# Patient Record
Sex: Male | Born: 1940 | Race: White | Hispanic: No | Marital: Married | Smoking: Never smoker
Health system: Southern US, Community
[De-identification: ages and names within clinical notes are randomized; demographics above are authoritative.]

---

## 2015-06-24 ENCOUNTER — Ambulatory Visit: Payer: Worker's Compensation

## 2015-06-24 ENCOUNTER — Ambulatory Visit (INDEPENDENT_AMBULATORY_CARE_PROVIDER_SITE_OTHER): Payer: Worker's Compensation | Admitting: Urgent Care

## 2015-06-24 VITALS — BP 114/62 | HR 80 | Temp 97.9°F | Resp 16 | Wt 227.0 lb

## 2015-06-24 DIAGNOSIS — S99912A Unspecified injury of left ankle, initial encounter: Secondary | ICD-10-CM

## 2015-06-24 DIAGNOSIS — M25572 Pain in left ankle and joints of left foot: Secondary | ICD-10-CM

## 2015-06-24 DIAGNOSIS — S93492A Sprain of other ligament of left ankle, initial encounter: Secondary | ICD-10-CM

## 2015-06-24 DIAGNOSIS — S93432A Sprain of tibiofibular ligament of left ankle, initial encounter: Secondary | ICD-10-CM

## 2015-06-24 DIAGNOSIS — M25472 Effusion, left ankle: Secondary | ICD-10-CM

## 2015-06-24 NOTE — Patient Instructions (Signed)
Ankle Sprain  An ankle sprain is an injury to the strong, fibrous tissues (ligaments) that hold the bones of your ankle joint together.   CAUSES  An ankle sprain is usually caused by a fall or by twisting your ankle. Ankle sprains most commonly occur when you step on the outer edge of your foot, and your ankle turns inward. People who participate in sports are more prone to these types of injuries.   SYMPTOMS    Pain in your ankle. The pain may be present at rest or only when you are trying to stand or walk.   Swelling.   Bruising. Bruising may develop immediately or within 1 to 2 days after your injury.   Difficulty standing or walking, particularly when turning corners or changing directions.  DIAGNOSIS   Your caregiver will ask you details about your injury and perform a physical exam of your ankle to determine if you have an ankle sprain. During the physical exam, your caregiver will press on and apply pressure to specific areas of your foot and ankle. Your caregiver will try to move your ankle in certain ways. An X-ray exam may be done to be sure a bone was not broken or a ligament did not separate from one of the bones in your ankle (avulsion fracture).   TREATMENT   Certain types of braces can help stabilize your ankle. Your caregiver can make a recommendation for this. Your caregiver may recommend the use of medicine for pain. If your sprain is severe, your caregiver may refer you to a surgeon who helps to restore function to parts of your skeletal system (orthopedist) or a physical therapist.  HOME CARE INSTRUCTIONS    Apply ice to your injury for 1-2 days or as directed by your caregiver. Applying ice helps to reduce inflammation and pain.    Put ice in a plastic bag.    Place a towel between your skin and the bag.    Leave the ice on for 15-20 minutes at a time, every 2 hours while you are awake.   Only take over-the-counter or prescription medicines for pain, discomfort, or fever as directed by  your caregiver.   Elevate your injured ankle above the level of your heart as much as possible for 2-3 days.   If your caregiver recommends crutches, use them as instructed. Gradually put weight on the affected ankle. Continue to use crutches or a cane until you can walk without feeling pain in your ankle.   If you have a plaster splint, wear the splint as directed by your caregiver. Do not rest it on anything harder than a pillow for the first 24 hours. Do not put weight on it. Do not get it wet. You may take it off to take a shower or bath.   You may have been given an elastic bandage to wear around your ankle to provide support. If the elastic bandage is too tight (you have numbness or tingling in your foot or your foot becomes cold and blue), adjust the bandage to make it comfortable.   If you have an air splint, you may blow more air into it or let air out to make it more comfortable. You may take your splint off at night and before taking a shower or bath. Wiggle your toes in the splint several times per day to decrease swelling.  SEEK MEDICAL CARE IF:    You have rapidly increasing bruising or swelling.   Your toes feel   extremely cold or you lose feeling in your foot.   Your pain is not relieved with medicine.  SEEK IMMEDIATE MEDICAL CARE IF:   Your toes are numb or blue.   You have severe pain that is increasing.  MAKE SURE YOU:    Understand these instructions.   Will watch your condition.   Will get help right away if you are not doing well or get worse.     This information is not intended to replace advice given to you by your health care provider. Make sure you discuss any questions you have with your health care provider.     Document Released: 03/12/2005 Document Revised: 04/02/2014 Document Reviewed: 03/24/2011  Elsevier Interactive Patient Education 2016 [employer]

## 2015-06-24 NOTE — Progress Notes (Signed)
    MRN: [id number] DOB: 05-19-1940  Subjective:   Allen Mitchell is a 75 y.o. 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 Patient had moderate-severe left ankle pain, left ankle swelling, difficulty bearing weight. He has since been icing his left ankle 1-2 hours at a time. Denies weakness, redness, warmth, bruising.   The patient's medications, allergies, pmh, psh were reviewed and not included due to being a worker's comp case.   Objective:   Vitals: BP 114/62 mmHg  Pulse 80  Temp(Src) 97.9 F (36.6 C) (Oral)  Resp 16  Wt 227 lb (102.967 kg)  SpO2 98%  Physical Exam  Constitutional: He is oriented to person, place, and time. He appears well-developed and well-nourished.  Cardiovascular: Normal rate.   Pulmonary/Chest: Effort normal.  Musculoskeletal:       Left ankle: He exhibits decreased range of motion (plantar flexion, dorsal flexion, eversion) and swelling. He exhibits no ecchymosis, no deformity, no laceration and normal pulse. Tenderness (over distal fibula). Lateral malleolus, medial malleolus and AITFL tenderness found. No CF ligament, no posterior TFL, no head of 5th metatarsal and no proximal fibula tenderness found. Achilles tendon exhibits no pain and no defect.  Neurological: He is alert and oriented to person, place, and time.  Skin: Skin is warm and dry.    Dg Ankle Complete Left  06/24/2015  CLINICAL DATA:  Left ankle injury, ankle pain and swelling EXAM: LEFT ANKLE COMPLETE - 3+ VIEW COMPARISON:  None. FINDINGS: Degenerative changes within the left ankle with osteophyte formation/spurring. No acute bony abnormality. Specifically, no fracture, subluxation, or dislocation. Soft tissues are intact. IMPRESSION: Degenerative changes.  No acute bony abnormality. Electronically Signed   By: Charlett NoseKevin  Dover M.D.   On: 06/24/2015 11:47   Assessment and Plan :   1. High ankle  sprain, left, initial encounter 2. Left ankle injury, initial encounter 3. Left ankle pain 4. Left ankle swelling - Patient placed in a cam-walker, provided with crutches. Work restrictions provided. RTC in 1 week for re-evaluation.  Wallis BambergMario Ariba Lehnen, PA-C [facility] [phone] 06/24/2015 11:44 AM

## 2015-07-01 ENCOUNTER — Ambulatory Visit (INDEPENDENT_AMBULATORY_CARE_PROVIDER_SITE_OTHER): Payer: Worker's Compensation | Admitting: Physician Assistant

## 2015-07-01 VITALS — BP 102/50 | HR 78 | Temp 97.7°F | Resp 16 | Ht 71.0 in

## 2015-07-01 DIAGNOSIS — M25472 Effusion, left ankle: Secondary | ICD-10-CM | POA: Diagnosis not present

## 2015-07-01 DIAGNOSIS — S93492A Sprain of other ligament of left ankle, initial encounter: Secondary | ICD-10-CM

## 2015-07-01 DIAGNOSIS — M25572 Pain in left ankle and joints of left foot: Secondary | ICD-10-CM | POA: Diagnosis not present

## 2015-07-01 DIAGNOSIS — S99912A Unspecified injury of left ankle, initial encounter: Secondary | ICD-10-CM | POA: Diagnosis not present

## 2015-07-01 DIAGNOSIS — S93432A Sprain of tibiofibular ligament of left ankle, initial encounter: Secondary | ICD-10-CM

## 2015-07-01 NOTE — Progress Notes (Signed)
   07/01/2015 2:02 PM   DOB: 22-Jul-1940 / MRN: [id number]  SUBJECTIVE:  Allen Mitchell is a 75 y.o. male presenting for WC injury.  First seen on 06/24/15 by PA Urban GibsonMani and diagnosed with left high ankle sprain. He was given a cam walker, crutches, work restrictions. He has been using crutches.  He can not perform his work while using crutches as there is no desk work.  Today he reports feeling better, but far from well.  Continues to complain of swelling and pain.  Negative for fibular head pain.     Review of Systems  Constitutional: Negative for fever and chills.  Eyes: Negative for blurred vision.  Respiratory: Negative for cough and shortness of breath.   Cardiovascular: Negative for chest pain.  Gastrointestinal: Negative for nausea and abdominal pain.  Genitourinary: Negative for dysuria, urgency and frequency.  Musculoskeletal: Negative for myalgias.  Skin: Negative for rash.  Neurological: Negative for dizziness, tingling and headaches.  Psychiatric/Behavioral: Negative for depression. The patient is not nervous/anxious.     Problem list and medications reviewed and updated by myself where necessary, and exist elsewhere in the encounter.   OBJECTIVE:  BP 102/50 mmHg  Pulse 78  Temp(Src) 97.7 F (36.5 C) (Oral)  Resp 16  Ht 5\' 11"  (1.803 m)  SpO2 98%  Physical Exam  Constitutional: He is oriented to person, place, and time. He appears well-developed. He does not appear ill.  Eyes: Conjunctivae and EOM are normal. Pupils are equal, round, and reactive to light.  Cardiovascular: Normal rate.   Pulmonary/Chest: Effort normal.  Abdominal: He exhibits no distension.  Musculoskeletal: Normal range of motion.       Feet:  Neurological: He is alert and oriented to person, place, and time. No cranial nerve deficit. Coordination normal.  Skin: Skin is warm and dry. He is not diaphoretic.  Psychiatric: He has a normal mood and affect.  Nursing note and vitals reviewed.   No  results found for this or any previous visit (from the past 72 hour(s)).  No results found.  ASSESSMENT AND PLAN  Allen Mitchell was seen today for follow-up.  Diagnoses and all orders for this visit:  High ankle sprain, left, initial encounter: He needs more time.  Will keep him out of work for another week.   He is to return in 7 days for a recheck.    Left ankle injury, initial encounter  Left ankle pain  Left ankle swelling   The patient was advised to call or return to clinic if he does not see an improvement in symptoms or to seek the care of the closest emergency department if he worsens with the above plan.   Deliah BostonMichael Nuri Branca, MHS, PA-C [facility] 07/01/2015 2:02 PM3

## 2015-07-01 NOTE — Patient Instructions (Signed)
     IF you received an x-ray today, you will receive an invoice from [facility]. Please contact [facility] at [phone] with questions or concerns regarding your invoice.   IF you received labwork today, you will receive an invoice from [facility]. Please contact [facility] at [phone] with questions or concerns regarding your invoice.   Our billing staff will not be able to assist you with questions regarding bills from these companies.  You will be contacted with the lab results as soon as they are available. The fastest way to get your results is to activate your My Chart account. Instructions are located on the last page of this paperwork. If you have not heard from us regarding the results in 2 weeks, please contact this office.      

## 2015-07-08 ENCOUNTER — Ambulatory Visit (INDEPENDENT_AMBULATORY_CARE_PROVIDER_SITE_OTHER): Payer: Worker's Compensation | Admitting: Physician Assistant

## 2015-07-08 VITALS — BP 100/68 | HR 79 | Temp 97.6°F | Resp 17 | Ht 71.0 in | Wt 231.0 lb

## 2015-07-08 DIAGNOSIS — S82402A Unspecified fracture of shaft of left fibula, initial encounter for closed fracture: Secondary | ICD-10-CM

## 2015-07-08 DIAGNOSIS — S93492A Sprain of other ligament of left ankle, initial encounter: Secondary | ICD-10-CM

## 2015-07-08 DIAGNOSIS — S93432A Sprain of tibiofibular ligament of left ankle, initial encounter: Secondary | ICD-10-CM | POA: Diagnosis not present

## 2015-07-08 NOTE — Progress Notes (Signed)
   07/08/2015 9:43 AM   DOB: 1940/05/02 / MRN: [id number]  SUBJECTIVE:  Allen Mitchell is a 75 y.o. male presenting for a recheck of his ankle.  He reports he is 100% better and is able to ambulate without difficulty.  Has been wearing cam walker and using crutches.  Complains of pain along the distal one third of the fibula.    He has No Known Allergies.    Review of Systems  Constitutional: Negative for fever.  Cardiovascular: Negative for chest pain.  Musculoskeletal: Positive for myalgias and joint pain. Negative for falls.  Skin: Negative for rash.  Neurological: Negative for dizziness.    Problem list and medications reviewed and updated by myself where necessary, and exist elsewhere in the encounter.   OBJECTIVE:  BP 100/68 mmHg  Pulse 79  Temp(Src) 97.6 F (36.4 C) (Oral)  Resp 17  Ht 5\' 11"  (1.803 m)  Wt 231 lb (104.781 kg)  BMI 32.23 kg/m2  SpO2 97%  Physical Exam  Constitutional: He is oriented to person, place, and time. He appears well-developed. He does not appear ill.  Eyes: Conjunctivae and EOM are normal. Pupils are equal, round, and reactive to light.  Cardiovascular: Normal rate.   Pulmonary/Chest: Effort normal.  Abdominal: He exhibits no distension.  Musculoskeletal: Normal range of motion.       Legs: Neurological: He is alert and oriented to person, place, and time. No cranial nerve deficit. Coordination and gait normal.  Skin: Skin is warm and dry. He is not diaphoretic.  Psychiatric: He has a normal mood and affect.  Nursing note and vitals reviewed.   No results found for this or any previous visit (from the past 72 hour(s)).  No results found.  ASSESSMENT AND PLAN  Allen Mitchell was seen today for follow-up.  Diagnoses and all orders for this visit:  High ankle sprain, left, initial encounter: He has a non displaced fracture of the distal 1/3.  This was not called on initial radiography.  Will keep him out of work for one more week and he  will need a repeat radiograph on 07/15/15. Advise he follow up with myself or Dr. Neva SeatGreene who is familiar with his case.    Closed fibular fracture, left, initial encounter: See problem one.    The patient was advised to call or return to clinic if he does not see an improvement in symptoms or to seek the care of the closest emergency department if he worsens with the above plan.   Deliah BostonMichael Riyad Keena, MHS, PA-C [facility] 07/08/2015 9:43 AM

## 2015-07-08 NOTE — Patient Instructions (Addendum)
Please continue to use your crutches and cam walker at all times.      IF you received an x-ray today, you will receive an [facility]. Please contact [facility] at [phone] with questions or concerns regarding your invoice.   IF you received labwork today, you will receive an invoice from [facility]. Please contact [facility] at [phone] with questions or concerns regarding your invoice.   Our billing staff will not be able to assist you with questions regarding bills from these companies.  You will be contacted with the lab results as soon as they are available. The fastest way to get your results is to activate your My Chart account. Instructions are located on the last page of this paperwork. If you have not heard from us regarding the results in 2 weeks, please contact this office.

## 2015-07-15 ENCOUNTER — Ambulatory Visit: Payer: Worker's Compensation

## 2015-07-15 ENCOUNTER — Ambulatory Visit (INDEPENDENT_AMBULATORY_CARE_PROVIDER_SITE_OTHER): Payer: Worker's Compensation | Admitting: Internal Medicine

## 2015-07-15 VITALS — BP 120/80 | HR 84 | Temp 98.2°F | Resp 18 | Wt 229.0 lb

## 2015-07-15 DIAGNOSIS — M25572 Pain in left ankle and joints of left foot: Secondary | ICD-10-CM

## 2015-07-15 DIAGNOSIS — S93432A Sprain of tibiofibular ligament of left ankle, initial encounter: Secondary | ICD-10-CM | POA: Diagnosis not present

## 2015-07-15 DIAGNOSIS — S82402A Unspecified fracture of shaft of left fibula, initial encounter for closed fracture: Secondary | ICD-10-CM

## 2015-07-15 DIAGNOSIS — M25472 Effusion, left ankle: Secondary | ICD-10-CM

## 2015-07-15 DIAGNOSIS — S93492A Sprain of other ligament of left ankle, initial encounter: Secondary | ICD-10-CM

## 2015-07-15 NOTE — Progress Notes (Signed)
07/15/2015 4:23 PM   DOB: 05/15/1940 / MRN: [id number]  SUBJECTIVE:  Allen Allen is a 75 y.o. male presenting for a recheck of an ankle and fibular injury that occurred at work. He reports that he is feeling much better now and has been compliant with crutches and wearing his tall cam walker.  He continues to have pain with weight bearing and works a very physical job.    He has No Known Allergies.    Review of Systems  Constitutional: Negative for fever.  Cardiovascular: Negative for chest pain.  Musculoskeletal: Positive for myalgias and joint pain. Negative for falls.  Skin: Negative for rash.  Neurological: Negative for dizziness.    Problem list and medications reviewed and updated by myself where necessary, and exist elsewhere in the encounter.   OBJECTIVE:  BP 120/80 mmHg  Pulse 84  Temp(Src) 98.2 F (36.8 C) (Oral)  Resp 18  Wt 229 lb (103.874 kg)  SpO2 98%  Physical Exam  Constitutional: He is oriented to person, place, and time. He appears well-developed. He does not appear ill.  Eyes: Conjunctivae and EOM are normal. Pupils are equal, round, and reactive to light.  Cardiovascular: Normal rate.   Pulmonary/Chest: Effort normal.  Abdominal: He exhibits no distension.  Musculoskeletal: Normal range of motion.       Legs: Neurological: He is alert and oriented to person, place, and time. No cranial nerve deficit. Coordination and gait normal.  Skin: Skin is warm and dry. He is not diaphoretic.  Psychiatric: He has a normal mood and affect.  Nursing note and vitals reviewed.   No results found for this or any previous visit (from the past 72 hour(s)).  Dg Tibia/fibula Left  07/15/2015  CLINICAL DATA:  Follow-up lower fibular fracture. EXAM: LEFT TIBIA AND FIBULA - 2 VIEW COMPARISON:  Left ankle series of today's date and June 24, 2015 FINDINGS: The fracture involving the distal third of the shaft of the left fibula is not fully included in the field of  view. There are mild degenerative changes of the knee with beaking of the tibial spines and spurring of the articular margins of the patella. No proximal or midshaft tibial or fibular fractures are observed. IMPRESSION: Patient has a known healing spiral fracture of the distal third of the shaft of the left fibula. The proximal 2/3 of the tibia and fibula appear intact. Electronically Signed   By: David  SwazilandJordan M.D.   On: 07/15/2015 09:30   Dg Ankle Complete Left  07/15/2015  CLINICAL DATA:  Follow-up of ankle injury EXAM: LEFT ANKLE COMPLETE - 3+ VIEW COMPARISON:  Ankle series dated June 24, 2015 FINDINGS: A small amount of periosteal reaction is seen surrounding the spiral fracture of the distal third of the fibular shaft. There are significant degenerative changes associated with the lateral malleolus with a large inferior spur. The ankle joint mortise appears mildly widened though stable. Significant degenerative changes associated with the medial malleolus and medial aspect of the talus is observed. The talar dome is intact. There are calcaneal spurs. IMPRESSION: Ongoing healing of the spiral fracture of the distal left fibular shaft. 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 Electronically Signed   By: David  Swazil[place]Jordan M.D.   On: 07/15/2015 09:27    ASSESSMENT AND PLAN  Allen Allen was seen today for follow-up.  Diagnoses and all orders for this visit:  High ankle sprain, left, subsequent encounter: He is continuing to improve.  Advised that he begin to ambulate without the crutches as tolerated.  Will plan to re-image in two weeks.  He is to come back if he worsens.  Work documentation provided.   -     DG Ankle Complete Left; Future  Closed fibular fracture, left, subsequent encounter -     DG Tibia/Fibula Left; Future    The patient was advised to call or return to clinic if he does not see an improvement in symptoms or to seek the care of the closest  emergency department if he worsens with the above plan.   Deliah Boston, MHS, PA-C [facility] 07/15/2015 4:23 PM   I have participated in the care of this patient with the Advanced Practice Provider and agree with Diagnosis and Plan as documented. Robert P. Merla Riches, M.D.

## 2015-07-15 NOTE — Patient Instructions (Addendum)
Continue wearing your boot.  Use the crutches sparingly and let pain be your guide.  We will see you back in 2 weeks.   IF you received an x-ray today, you will receive an invoice from [facility]. Please contact [facility] at [phone] with questions or concerns regarding your invoice.   IF you received labwork today, you will receive an invoice from [facility]. Please contact [facility] at [phone] with questions or concerns regarding your invoice.   Our billing staff will not be able to assist you with questions regarding bills from these companies.  You will be contacted with the lab results as soon as they are available. The fastest way to get your results is to activate your My Chart account. Instructions are located on the last page of this paperwork. If you have not heard from us regarding the results in 2 weeks, please contact this office.

## 2015-07-25 ENCOUNTER — Encounter: Payer: Self-pay | Admitting: Internal Medicine

## 2015-07-25 ENCOUNTER — Other Ambulatory Visit: Payer: Self-pay | Admitting: Physician Assistant

## 2015-07-25 ENCOUNTER — Telehealth: Payer: Self-pay

## 2015-07-25 NOTE — Telephone Encounter (Signed)
Patient needs paperwork filled out by Deliah BostonMichael Clark, I will place them in your box on 07/25/15 please return them to the FMLA/Disability box at the 102 checkout desk within 5-7 business day. Thank you!

## 2015-07-26 NOTE — Telephone Encounter (Signed)
Paperwork scanned and faxed to [phone] on 07/26/15

## 2015-07-29 ENCOUNTER — Ambulatory Visit (INDEPENDENT_AMBULATORY_CARE_PROVIDER_SITE_OTHER): Payer: Worker's Compensation | Admitting: Physician Assistant

## 2015-07-29 VITALS — BP 102/64 | HR 73 | Temp 98.2°F | Resp 16 | Ht 71.0 in | Wt 234.0 lb

## 2015-07-29 DIAGNOSIS — S99912A Unspecified injury of left ankle, initial encounter: Secondary | ICD-10-CM | POA: Diagnosis not present

## 2015-07-29 DIAGNOSIS — S93432A Sprain of tibiofibular ligament of left ankle, initial encounter: Secondary | ICD-10-CM | POA: Diagnosis not present

## 2015-07-29 DIAGNOSIS — M25472 Effusion, left ankle: Secondary | ICD-10-CM

## 2015-07-29 DIAGNOSIS — S82402A Unspecified fracture of shaft of left fibula, initial encounter for closed fracture: Secondary | ICD-10-CM

## 2015-07-29 DIAGNOSIS — S93492A Sprain of other ligament of left ankle, initial encounter: Secondary | ICD-10-CM

## 2015-07-29 DIAGNOSIS — M25572 Pain in left ankle and joints of left foot: Secondary | ICD-10-CM | POA: Diagnosis not present

## 2015-07-29 NOTE — Patient Instructions (Signed)
     IF you received an x-ray today, you will receive an invoice from [facility]. Please contact  Radiology at [phone] with questions or concerns regarding your invoice.   IF you received labwork today, you will receive an invoice from [facility]. Please contact [facility] at [phone] with questions or concerns regarding your invoice.   Our billing staff will not be able to assist you with questions regarding bills from these companies.  You will be contacted with the lab results as soon as they are available. The fastest way to get your results is to activate your My Chart account. Instructions are located on the last page of this paperwork. If you have not heard from us regarding the results in 2 weeks, please contact this office.      

## 2015-07-29 NOTE — Progress Notes (Signed)
   07/29/2015 9:56 AM   DOB: May 04, 1940 / MRN: [id number]  SUBJECTIVE:  Allen Mitchell is a 75 y.o. male presenting for a recheck of a left ankle injury sustained at work about 6 weeks ago.  He has progressed from cam walker and crutches to using the cam walker only.  Reports he is doing fairly well however continues to have pain with ambulation and has not tried ambulating without the cam walker.  Reports his job is very physical and requires that he wear steal toe boots.   He has No Known Allergies.   He  has no past medical history on file.    He  reports that he has never smoked. He does not have any smokeless tobacco history on file. He  has no sexual activity history on file. The patient  has no past surgical history on file.  His family history is not on file.  Review of Systems  Constitutional: Negative for fever.  Cardiovascular: Negative for chest pain.  Musculoskeletal: Positive for myalgias and joint pain. Negative for falls.  Skin: Negative for rash.  Neurological: Negative for dizziness.    Problem list and medications reviewed and updated by myself where necessary, and exist elsewhere in the encounter.   OBJECTIVE:  BP 102/64 mmHg  Pulse 73  Temp(Src) 98.2 F (36.8 C) (Oral)  Resp 16  Ht 5\' 11"  (1.803 m)  Wt 234 lb (106.142 kg)  BMI 32.65 kg/m2  SpO2 97%  Physical Exam  Constitutional: He is oriented to person, place, and time. He appears well-developed. He does not appear ill.  Eyes: Conjunctivae and EOM are normal. Pupils are equal, round, and reactive to light.  Cardiovascular: Normal rate.   Pulmonary/Chest: Effort normal.  Abdominal: He exhibits no distension.  Musculoskeletal: Normal range of motion.  Neurological: He is alert and oriented to person, place, and time. No cranial nerve deficit. Coordination normal.  Skin: Skin is warm and dry. He is not diaphoretic.  Psychiatric: He has a normal mood and affect.  Nursing note and vitals reviewed.   No  results found for this or any previous visit (from the past 72 hour(s)).  No results found.  ASSESSMENT AND PLAN  Allen Mitchell was seen today for follow-up.  Diagnoses and all orders for this visit:  High ankle sprain, left, subsequent encounter: Advised that he progress to avoiding the cam walker when possible.  I am hopeful that he will be able to return to work after his next follow in two weeks.  Will obtain a final radiograph at that time.   Closed fibular fracture, left, subsequent encounter  Left ankle injury, initial encounter  Left ankle pain  Left ankle swelling    The patient was advised to call or return to clinic if he does not see an improvement in symptoms or to seek the care of the closest emergency department if he worsens with the above plan.   Deliah BostonMichael Perian Tedder, MHS, PA-C [facility] 07/29/2015 9:56 AM

## 2015-08-12 ENCOUNTER — Ambulatory Visit (INDEPENDENT_AMBULATORY_CARE_PROVIDER_SITE_OTHER): Payer: Worker's Compensation | Admitting: Physician Assistant

## 2015-08-12 ENCOUNTER — Ambulatory Visit: Payer: Self-pay

## 2015-08-12 VITALS — BP 114/62 | HR 82 | Temp 98.3°F | Resp 16

## 2015-08-12 DIAGNOSIS — Z026 Encounter for examination for insurance purposes: Secondary | ICD-10-CM

## 2015-08-12 DIAGNOSIS — S82402A Unspecified fracture of shaft of left fibula, initial encounter for closed fracture: Secondary | ICD-10-CM

## 2015-08-12 NOTE — Patient Instructions (Signed)
     IF you received an x-ray today, you will receive an invoice from [facility]. Please contact [facility] at [phone] with questions or concerns regarding your invoice.   IF you received labwork today, you will receive an invoice from [facility]. Please contact [facility] at [phone] with questions or concerns regarding your invoice.   Our billing staff will not be able to assist you with questions regarding bills from these companies.  You will be contacted with the lab results as soon as they are available. The fastest way to get your results is to activate your My Chart account. Instructions are located on the last page of this paperwork. If you have not heard from us regarding the results in 2 weeks, please contact this office.      

## 2015-08-12 NOTE — Progress Notes (Signed)
08/12/2015 9:18 AM   DOB: 1940/08/25 / MRN: [id number]  SUBJECTIVE:  Allen Mitchell Matlack is a 75 y.o. male presenting for a recheck of a worker's comp ankle injury that occurred in late March of 17. He has progressed normally thus far but has been unable to return to work as he works a very physical job.  He has now progressed to using the cam walker only occasionally. He reports that he has been walking roughly 1 mile daily but not all at once.  He would like to return to work today.    He has No Known Allergies.   He  has no past medical history on file.    He  reports that he has never smoked. He does not have any smokeless tobacco history on file. He  has no sexual activity history on file. The patient  has no past surgical history on file.  His family history is not on file.  Review of Systems  Constitutional: Negative for fever and chills.  Eyes: Negative for blurred vision.  Respiratory: Negative for cough and shortness of breath.   Cardiovascular: Negative for chest pain.  Gastrointestinal: Negative for nausea and abdominal pain.  Genitourinary: Negative for dysuria, urgency and frequency.  Musculoskeletal: Positive for joint pain. Negative for myalgias.  Skin: Negative for rash.  Neurological: Negative for dizziness, tingling and headaches.  Psychiatric/Behavioral: Negative for depression. The patient is not nervous/anxious.     Problem list and medications reviewed and updated by myself where necessary, and exist elsewhere in the encounter.   OBJECTIVE:  BP 114/62 mmHg  Pulse 82  Temp(Src) 98.3 F (36.8 C)  Resp 16  SpO2 97%  Physical Exam  Constitutional: He is oriented to person, place, and time. He appears well-developed. He does not appear ill.  Eyes: Conjunctivae and EOM are normal. Pupils are equal, round, and reactive to light.  Cardiovascular: Normal rate.   Pulmonary/Chest: Effort normal.  Abdominal: He exhibits no distension.  Musculoskeletal: Normal range  of motion.       Legs: Neurological: He is alert and oriented to person, place, and time. No cranial nerve deficit. Coordination normal.  Skin: Skin is warm and dry. He is not diaphoretic.  Psychiatric: He has a normal mood and affect.  Nursing note and vitals reviewed.   No results found for this or any previous visit (from the past 72 hour(s)).  Dg Ankle Complete Left  08/12/2015  CLINICAL DATA:  Followup fracture of 07/15/2015 EXAM: LEFT ANKLE COMPLETE - 3+ VIEW COMPARISON:  07/15/2015 FINDINGS: Interval callus formation about the fibular diaphyseal fracture. The ankle mortise is intact. 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 IMPRESSION: Continued interval healing of the distal fibular fracture. Electronically Signed   By: Genevive BiStewart  Edmunds M.D.   On: 08/12/2015 08:42    ASSESSMENT AND PLAN  Allen Mitchell was seen today for follow-up.  Diagnoses and all orders for this visit:  Closed fibular fracture, left, subsequent encounter: He continues healing.  I have supplied him with a hefty ace wrap and advised that he return to work in 3 days however only for four hours daily.  He will follow up next Friday so that I can assess his progress in the hopes that he can work a longer shift.   -     DG Ankle Complete Left; Future    The patient was advised to call or return to clinic if he does not see an improvement in symptoms or to seek  the care of the closest emergency department if he worsens with the above plan.   Deliah Boston, MHS, PA-C [facility] 08/12/2015 9:18 AM

## 2015-08-19 ENCOUNTER — Ambulatory Visit (INDEPENDENT_AMBULATORY_CARE_PROVIDER_SITE_OTHER): Payer: Worker's Compensation | Admitting: Physician Assistant

## 2015-08-19 VITALS — BP 122/72 | HR 78 | Temp 98.0°F | Resp 17 | Ht 70.5 in | Wt 236.0 lb

## 2015-08-19 DIAGNOSIS — M25572 Pain in left ankle and joints of left foot: Secondary | ICD-10-CM

## 2015-08-19 DIAGNOSIS — M25472 Effusion, left ankle: Secondary | ICD-10-CM

## 2015-08-19 DIAGNOSIS — S93492A Sprain of other ligament of left ankle, initial encounter: Secondary | ICD-10-CM

## 2015-08-19 DIAGNOSIS — S93432A Sprain of tibiofibular ligament of left ankle, initial encounter: Secondary | ICD-10-CM | POA: Diagnosis not present

## 2015-08-19 DIAGNOSIS — S99912A Unspecified injury of left ankle, initial encounter: Secondary | ICD-10-CM | POA: Diagnosis not present

## 2015-08-19 DIAGNOSIS — Z026 Encounter for examination for insurance purposes: Secondary | ICD-10-CM | POA: Diagnosis not present

## 2015-08-19 DIAGNOSIS — S82402A Unspecified fracture of shaft of left fibula, initial encounter for closed fracture: Secondary | ICD-10-CM

## 2015-08-19 NOTE — Progress Notes (Signed)
   08/19/2015 8:35 AM   DOB: 1940/05/04 / MRN: [id number]  SUBJECTIVE:  Allen Mitchell is a 75 y.o. male presenting for a recheck of a worker's comp ankle injury that occurred in late March of 17. He is generally doing better but is surprised at how slowly he is improving.   Complains that his ankle swells very badly after just working four hours, and has been having lots of pain with getting back to work.    He has No Known Allergies.   He  has no past medical history on file.    He  reports that he has never smoked. He does not have any smokeless tobacco history on file. He  has no sexual activity history on file. The patient  has no past surgical history on file.  His family history is not on file.  Review of Systems  Constitutional: Negative for fever and chills.  Eyes: Negative for blurred vision.  Respiratory: Negative for cough and shortness of breath.   Cardiovascular: Negative for chest pain.  Gastrointestinal: Negative for nausea and abdominal pain.  Genitourinary: Negative for dysuria, urgency and frequency.  Musculoskeletal: Positive for joint pain. Negative for myalgias.  Skin: Negative for rash.  Neurological: Negative for dizziness, tingling and headaches.  Psychiatric/Behavioral: Negative for depression. The patient is not nervous/anxious.     Problem list and medications reviewed and updated by myself where necessary, and exist elsewhere in the encounter.   OBJECTIVE:  BP 122/72 mmHg  Pulse 78  Temp(Src) 98 F (36.7 C) (Oral)  Resp 17  Ht 5' 10.5" (1.791 m)  Wt 236 lb (107.049 kg)  BMI 33.37 kg/m2  SpO2 96%  Physical Exam  Constitutional: He is oriented to person, place, and time. He appears well-developed. He does not appear ill.  Eyes: Conjunctivae and EOM are normal. Pupils are equal, round, and reactive to light.  Cardiovascular: Normal rate.   Pulmonary/Chest: Effort normal.  Abdominal: He exhibits no distension.  Musculoskeletal: Normal range of  motion.       Legs: Neurological: He is alert and oriented to person, place, and time. No cranial nerve deficit. Coordination normal.  Skin: Skin is warm and dry. He is not diaphoretic.  Psychiatric: He has a normal mood and affect.  Nursing note and vitals reviewed.   No results found for this or any previous visit (from the past 72 hour(s)).  No results found.  ASSESSMENT AND PLAN  [name] was seen today for follow-up.  Diagnoses and all orders for this visit:  Closed fibular fracture, left, subsequent encounter: He continues to improve.  Given HPI will advise that he continue 4 hour days for the next two weeks and will see him back with the hopes of extending his workdays to 6-8 hours daily.   The patient was advised to call or return to clinic if he does not see an improvement in symptoms or to seek the care of the closest emergency department if he worsens with the above plan.   Deliah BostonMichael Clark, MHS, PA-C [facility] 08/19/2015 8:35 AM

## 2015-08-19 NOTE — Patient Instructions (Signed)
     IF you received an x-ray today, you will receive an invoice from [facility]. Please contact  Radiology at [phone] with questions or concerns regarding your invoice.   IF you received labwork today, you will receive an invoice from [facility]. Please contact [facility] at [phone] with questions or concerns regarding your invoice.   Our billing staff will not be able to assist you with questions regarding bills from these companies.  You will be contacted with the lab results as soon as they are available. The fastest way to get your results is to activate your My Chart account. Instructions are located on the last page of this paperwork. If you have not heard from us regarding the results in 2 weeks, please contact this office.      

## 2015-09-02 ENCOUNTER — Ambulatory Visit (INDEPENDENT_AMBULATORY_CARE_PROVIDER_SITE_OTHER): Payer: Worker's Compensation | Admitting: Physician Assistant

## 2015-09-02 VITALS — BP 104/56 | HR 71 | Temp 97.9°F | Resp 16 | Ht 70.25 in | Wt 236.0 lb

## 2015-09-02 DIAGNOSIS — S82402A Unspecified fracture of shaft of left fibula, initial encounter for closed fracture: Secondary | ICD-10-CM | POA: Diagnosis not present

## 2015-09-02 DIAGNOSIS — Z026 Encounter for examination for insurance purposes: Secondary | ICD-10-CM | POA: Diagnosis not present

## 2015-09-02 DIAGNOSIS — M25572 Pain in left ankle and joints of left foot: Secondary | ICD-10-CM

## 2015-09-02 DIAGNOSIS — S93432A Sprain of tibiofibular ligament of left ankle, initial encounter: Secondary | ICD-10-CM

## 2015-09-02 DIAGNOSIS — S93492A Sprain of other ligament of left ankle, initial encounter: Secondary | ICD-10-CM

## 2015-09-02 DIAGNOSIS — S99912A Unspecified injury of left ankle, initial encounter: Secondary | ICD-10-CM | POA: Diagnosis not present

## 2015-09-02 NOTE — Progress Notes (Signed)
   09/02/2015 8:41 AM   DOB: 10-12-1940 / MRN: [id number]  SUBJECTIVE:  Allen Allen Mitchell is a 75 y.o. male presenting for worker's comp recheck.  He reports progressively less swelling and pain about the ankle.  He would like to try longer shifts.    He has No Known Allergies.   He  has no past medical history on file.    He  reports that he has never smoked. He does not have any smokeless tobacco history on file. He  has no sexual activity history on file. The patient  has no past surgical history on file.  His family history is not on file.  Review of Systems  Musculoskeletal: Positive for joint pain.    Problem list and medications reviewed and updated by myself where necessary, and exist elsewhere in the encounter.   OBJECTIVE:  BP 104/56 mmHg  Pulse 71  Temp(Src) 97.9 F (36.6 C) (Oral)  Resp 16  Ht 5' 10.25" (1.784 m)  Wt 236 lb (107.049 kg)  BMI 33.64 kg/m2  SpO2 98%  Physical Exam  Constitutional: He is oriented to person, place, and time. He appears well-developed. He does not appear ill.  Eyes: Conjunctivae and EOM are normal. Pupils are equal, round, and reactive to light.  Cardiovascular: Normal rate.   Pulmonary/Chest: Effort normal.  Abdominal: He exhibits no distension.  Musculoskeletal: Normal range of motion.       Legs: Neurological: He is alert and oriented to person, place, and time. No cranial nerve deficit. Coordination normal.  Skin: Skin is warm and dry. He is not diaphoretic.  Psychiatric: He has a normal mood and affect.  Nursing note and vitals reviewed.   No results found for this or any previous visit (from the past 72 hour(s)).  No results found.  ASSESSMENT AND PLAN  Allen Allen Mitchell was seen today for follow-up.  Diagnoses and all orders for this visit:  Encounter related to worker's compensation claim: He is doing very well.  Will push his shifts up to 8 hours daily.  He will contact me if this is too much.  Will see him back in one month  for probable release from workman's comp.   Closed fibular fracture, left, subsequent encounter  High ankle sprain, left, subsequent encounter  Left ankle injury, initial encounter  Left ankle pain    The patient was advised to call or return to clinic if he does not see an improvement in symptoms or to seek the care of the closest emergency department if he worsens with the above plan.   Allen Allen Mitchell, MHS, PA-C [facility] 09/02/2015 8:41 AM

## 2015-09-30 ENCOUNTER — Ambulatory Visit (INDEPENDENT_AMBULATORY_CARE_PROVIDER_SITE_OTHER): Payer: Worker's Compensation | Admitting: Physician Assistant

## 2015-09-30 VITALS — BP 102/58 | HR 74 | Temp 98.1°F | Resp 14 | Ht 70.25 in | Wt 235.0 lb

## 2015-09-30 DIAGNOSIS — M25472 Effusion, left ankle: Secondary | ICD-10-CM

## 2015-09-30 DIAGNOSIS — S82402A Unspecified fracture of shaft of left fibula, initial encounter for closed fracture: Secondary | ICD-10-CM

## 2015-09-30 DIAGNOSIS — S99912A Unspecified injury of left ankle, initial encounter: Secondary | ICD-10-CM | POA: Diagnosis not present

## 2015-09-30 DIAGNOSIS — S93432A Sprain of tibiofibular ligament of left ankle, initial encounter: Secondary | ICD-10-CM | POA: Diagnosis not present

## 2015-09-30 DIAGNOSIS — Z026 Encounter for examination for insurance purposes: Secondary | ICD-10-CM

## 2015-09-30 DIAGNOSIS — M25572 Pain in left ankle and joints of left foot: Secondary | ICD-10-CM | POA: Diagnosis not present

## 2015-09-30 DIAGNOSIS — S93492A Sprain of other ligament of left ankle, initial encounter: Secondary | ICD-10-CM

## 2015-09-30 NOTE — Progress Notes (Signed)
   09/30/2015 8:22 AM   DOB: 1940/04/06 / MRN: [id number]  SUBJECTIVE:  Allen Mitchell is a 75 y.o. male presenting for a recheck of fibular fracture sustained at work.  I last saw him on 09/02/15 and increased his hours to 8 hours daily.  Today he reports an improvement in swelling and is tolerating 8 hour shifts with some difficulty, however states he is ready to try longer shifts. .    He has No Known Allergies.   He  has no past medical history on file.    He  reports that he has never smoked. He does not have any smokeless tobacco history on file. He  has no sexual activity history on file. The patient  has no past surgical history on file.  His family history is not on file.  Review of Systems  Musculoskeletal: Positive for joint pain.    Problem list and medications reviewed and updated by myself where necessary, and exist elsewhere in the encounter.   OBJECTIVE:  BP 102/58 mmHg  Pulse 74  Temp(Src) 98.1 F (36.7 C) (Oral)  Resp 14  Ht 5' 10.25" (1.784 m)  Wt 235 lb (106.595 kg)  BMI 33.49 kg/m2  SpO2 98%  Physical Exam  Constitutional: He is oriented to person, place, and time. He appears well-developed. He does not appear ill.  Eyes: Conjunctivae and EOM are normal. Pupils are equal, round, and reactive to light.  Cardiovascular: Normal rate.   Pulmonary/Chest: Effort normal.  Abdominal: He exhibits no distension.  Musculoskeletal: Normal range of motion.       Legs: Neurological: He is alert and oriented to person, place, and time. No cranial nerve deficit. Coordination normal.  Skin: Skin is warm and dry. He is not diaphoretic.  Psychiatric: He has a normal mood and affect.  Nursing note and vitals reviewed.   No results found for this or any previous visit.  No results found.  ASSESSMENT AND PLAN  Allen Mitchell was seen today for follow-up.  Diagnoses and all orders for this visit:  Encounter related to worker's compensation claim: I will see him back in  one month for final release from workers compensation.   Closed fibular fracture, left, subsequent encounter  High ankle sprain, left, subsequent encounter  Left ankle injury, initial encounter  Left ankle pain  Left ankle swelling   The patient was advised to call or return to clinic if he does not see an improvement in symptoms, or to seek the care of the closest emergency department if he worsens with the above plan.   Deliah BostonMichael Perfecto Purdy, MHS, PA-C [facility] 09/30/2015 8:22 AM

## 2015-09-30 NOTE — Patient Instructions (Signed)
     IF you received an x-ray today, you will receive an invoice from [facility]. Please contact [facility] at [phone] with questions or concerns regarding your invoice.   IF you received labwork today, you will receive an invoice from [facility]. Please contact [facility] at [phone] with questions or concerns regarding your invoice.   Our billing staff will not be able to assist you with questions regarding bills from these companies.  You will be contacted with the lab results as soon as they are available. The fastest way to get your results is to activate your My Chart account. Instructions are located on the last page of this paperwork. If you have not heard from us regarding the results in 2 weeks, please contact this office.      

## 2015-10-28 ENCOUNTER — Ambulatory Visit (INDEPENDENT_AMBULATORY_CARE_PROVIDER_SITE_OTHER): Payer: Worker's Compensation | Admitting: Physician Assistant

## 2015-10-28 VITALS — BP 126/74 | HR 88 | Temp 97.9°F | Resp 18 | Ht 70.25 in | Wt 235.0 lb

## 2015-10-28 DIAGNOSIS — S82402A Unspecified fracture of shaft of left fibula, initial encounter for closed fracture: Secondary | ICD-10-CM

## 2015-10-28 DIAGNOSIS — Z026 Encounter for examination for insurance purposes: Secondary | ICD-10-CM

## 2015-10-28 DIAGNOSIS — M25572 Pain in left ankle and joints of left foot: Secondary | ICD-10-CM

## 2015-10-28 DIAGNOSIS — S93432A Sprain of tibiofibular ligament of left ankle, initial encounter: Secondary | ICD-10-CM | POA: Diagnosis not present

## 2015-10-28 DIAGNOSIS — S93492A Sprain of other ligament of left ankle, initial encounter: Secondary | ICD-10-CM

## 2015-10-28 DIAGNOSIS — S99912A Unspecified injury of left ankle, initial encounter: Secondary | ICD-10-CM

## 2015-10-28 DIAGNOSIS — M25472 Effusion, left ankle: Secondary | ICD-10-CM

## 2015-10-28 NOTE — Progress Notes (Signed)
   10/28/2015 9:09 AM   DOB: November 14, 1940 / MRN: [id number]  SUBJECTIVE:  Allen Mitchell is a 75 y.o. male presenting for a recheck of fibular fracture sustained at work.  I last saw him on 09/02/15 and increased his hours to 8 hours daily.  Today he reports an improvement in swelling and is tolerating 8 hour shifts with some difficulty, however states he is ready to try longer shifts. .    He has No Known Allergies.   He  has no past medical history on file.    He  reports that he has never smoked. He has never used smokeless tobacco. He reports that he does not drink alcohol or use drugs. He  has no sexual activity history on file. The patient  has no past surgical history on file.  His family history is not on file.  Review of Systems  Musculoskeletal: Positive for joint pain.   Problem list and medications reviewed and updated by myself where necessary, and exist elsewhere in the encounter.   OBJECTIVE:  BP 126/74   Pulse 88   Temp 97.9 F (36.6 C) (Oral)   Resp 18   Ht 5' 10.25" (1.784 m)   Wt 235 lb (106.6 kg)   SpO2 99%   BMI 33.48 kg/m   Physical Exam  Constitutional: He is oriented to person, place, and time. He appears well-developed. He does not appear ill.  Eyes: Conjunctivae and EOM are normal. Pupils are equal, round, and reactive to light.  Cardiovascular: Normal rate.   Pulmonary/Chest: Effort normal.  Abdominal: He exhibits no distension.  Musculoskeletal: Normal range of motion.       Legs: Neurological: He is alert and oriented to person, place, and time. No cranial nerve deficit. Coordination normal.  Skin: Skin is warm and dry. He is not diaphoretic.  Psychiatric: He has a normal mood and affect.  Nursing note and vitals reviewed.   No results found for this or any previous visit.  No results found.  ASSESSMENT AND PLAN  Allen Mitchell was seen today for follow-up.  Diagnoses and all orders for this visit:  Encounter related to worker's compensation  claim: He is doing well.  Will release him from workers comp.  RTC as needed.   Closed fibular fracture, left, subsequent encounter  High ankle sprain, left, subsequent encounter  Left ankle injury, initial encounter  Left ankle pain  Left ankle swelling   The patient was advised to call or return to clinic if he does not see an improvement in symptoms, or to seek the care of the closest emergency department if he worsens with the above plan.   Deliah Boston, MHS, PA-C [facility] 10/28/2015 9:09 AM

## 2015-10-28 NOTE — Patient Instructions (Signed)
     IF you received an x-ray today, you will receive an invoice from [facility]. Please contact  Radiology at [phone] with questions or concerns regarding your invoice.   IF you received labwork today, you will receive an invoice from [facility]. Please contact [facility] at [phone] with questions or concerns regarding your invoice.   Our billing staff will not be able to assist you with questions regarding bills from these companies.  You will be contacted with the lab results as soon as they are available. The fastest way to get your results is to activate your My Chart account. Instructions are located on the last page of this paperwork. If you have not heard from us regarding the results in 2 weeks, please contact this office.      

## 2015-11-18 ENCOUNTER — Encounter: Payer: Self-pay | Admitting: Physician Assistant

## 2015-11-18 ENCOUNTER — Ambulatory Visit (INDEPENDENT_AMBULATORY_CARE_PROVIDER_SITE_OTHER): Payer: Worker's Compensation | Admitting: Physician Assistant

## 2015-11-18 VITALS — BP 138/70 | HR 82 | Temp 97.9°F | Resp 16 | Ht 70.0 in | Wt 235.6 lb

## 2015-11-18 DIAGNOSIS — Z026 Encounter for examination for insurance purposes: Secondary | ICD-10-CM | POA: Diagnosis not present

## 2015-11-18 DIAGNOSIS — S82402A Unspecified fracture of shaft of left fibula, initial encounter for closed fracture: Secondary | ICD-10-CM

## 2015-11-18 DIAGNOSIS — M25472 Effusion, left ankle: Secondary | ICD-10-CM | POA: Diagnosis not present

## 2015-11-18 DIAGNOSIS — M25572 Pain in left ankle and joints of left foot: Secondary | ICD-10-CM | POA: Diagnosis not present

## 2015-11-18 DIAGNOSIS — S93492A Sprain of other ligament of left ankle, initial encounter: Secondary | ICD-10-CM

## 2015-11-18 DIAGNOSIS — S99912A Unspecified injury of left ankle, initial encounter: Secondary | ICD-10-CM | POA: Diagnosis not present

## 2015-11-18 DIAGNOSIS — S93432A Sprain of tibiofibular ligament of left ankle, initial encounter: Secondary | ICD-10-CM | POA: Diagnosis not present

## 2015-11-18 NOTE — Patient Instructions (Signed)
     IF you received an x-ray today, you will receive an invoice from [facility]. Please contact [facility] at [phone] with questions or concerns regarding your invoice.   IF you received labwork today, you will receive an invoice from [facility]. Please contact [facility] at [phone] with questions or concerns regarding your invoice.   Our billing staff will not be able to assist you with questions regarding bills from these companies.  You will be contacted with the lab results as soon as they are available. The fastest way to get your results is to activate your My Chart account. Instructions are located on the last page of this paperwork. If you have not heard from us regarding the results in 2 weeks, please contact this office.      

## 2015-11-18 NOTE — Progress Notes (Signed)
   11/18/2015 2:55 PM   DOB: 29-May-1940 / MRN: [id number]  SUBJECTIVE:  Allen Allen is a 75 y.o. male presenting for left ankle pain and swelling. I had released him from worker's comp roughly 3 weeks ago due to continued improvement and his ability to tolerate longer hours at work.  He had at that time significant swelling but was able to ice the ankle after his shifts and this helped with swelling.  Since being released he complains of continued and often worsening swelling.  Reports that about four days ago his foot was so swollen his wife thought they may have to "cut his shoe off."  He has No Known Allergies.   He  has no past medical history on file.    He  reports that he has never smoked. He has never used smokeless tobacco. He reports that he does not drink alcohol or use drugs. He  has no sexual activity history on file. The patient  has no past surgical history on file.  His family history is not on file.  Review of Systems  Constitutional: Negative for fever.  Respiratory: Negative for shortness of breath.   Cardiovascular: Positive for leg swelling. Negative for chest pain.  Musculoskeletal: Positive for joint pain.  Skin: Negative for rash.  Neurological: Negative for dizziness.    The problem list and medications were reviewed and updated by myself where necessary and exist elsewhere in the encounter.   OBJECTIVE:  BP 138/70 (BP Location: Right Arm, Patient Position: Sitting, Cuff Size: Normal)   Pulse 82   Temp 97.9 F (36.6 C) (Oral)   Resp 16   Ht 5\' 10"  (1.778 m)   Wt 235 lb 9.6 oz (106.9 kg)   SpO2 97%   BMI 33.81 kg/m   Physical Exam  Constitutional: He is oriented to person, place, and time. He appears well-developed. He does not appear ill.  Eyes: Conjunctivae and EOM are normal. Pupils are equal, round, and reactive to light.  Cardiovascular: Normal rate and regular rhythm.   Pulmonary/Chest: Effort normal and breath sounds normal.  Abdominal: He  exhibits no distension.  Musculoskeletal: Normal range of motion.       Left ankle: He exhibits swelling. Tenderness (about distal fibula).  Neurological: He is alert and oriented to person, place, and time. No cranial nerve deficit. Coordination normal.  Skin: Skin is warm and dry. He is not diaphoretic.  Psychiatric: He has a normal mood and affect.  Nursing note and vitals reviewed.   No results found for this or any previous visit (from the past 72 hour(s)).  No results found.  ASSESSMENT AND PLAN  Allen Allen was seen today for joint swelling.  Diagnoses and all orders for this visit:  Left ankle swelling: Unfortunately his swelling is no longer improving.  Advised that he see an orthopedist for a second opinion.   -     AMB referral to orthopedics  Left ankle pain -     AMB referral to orthopedics  Left ankle injury, initial encounter  High ankle sprain, left, subsequent encounter  Closed fibular fracture, left, subsequent encounter  Encounter related to worker's compensation claim    The patient is advised to call or return to clinic if he does not see an improvement in symptoms, or to seek the care of the closest emergency department if he worsens with the above plan.   Deliah BostonMichael Zahlia Deshazer, MHS, PA-C [facility] 11/18/2015 2:55 PM

## 2015-12-21 ENCOUNTER — Telehealth: Payer: Self-pay

## 2015-12-21 NOTE — Telephone Encounter (Signed)
Patient has an appointment with an orthopedic specialist on Monday oct 10th ([facility]). Patient is need of copies of his xrays on a disc to take to this appointment. Patient needs the following xrays:  06/24/15 LT ankle 07/15/15 Lt ankle 07/15/15 Tibia/Fibula LT 08/12/15 Lt ankle  Please call patients wife Bonita QuinLinda when ready to be picked up at [phone]. They would like to picked them up by tomorrow Thursday 12/22/15

## 2015-12-21 NOTE — Telephone Encounter (Signed)
Please makes these copies for patient and leave up front. Call if you would like. Deliah BostonMichael Clark, MS, PA-C 6:07 PM, 12/21/2015

## 2015-12-21 NOTE — Telephone Encounter (Signed)
Please f/u

## 2015-12-21 NOTE — Telephone Encounter (Signed)
Spoke with Bonita QuinLinda and informed her that CD is ready for pick up under pts last night.

## 2016-06-09 IMAGING — CR DG ANKLE COMPLETE 3+V*L*
4 series · 4 of 4 positions shown · non-contrast
Comparison: None.

CLINICAL DATA: Left ankle injury, ankle pain and swelling

EXAM:
LEFT ANKLE COMPLETE - 3+ VIEW

[AP]
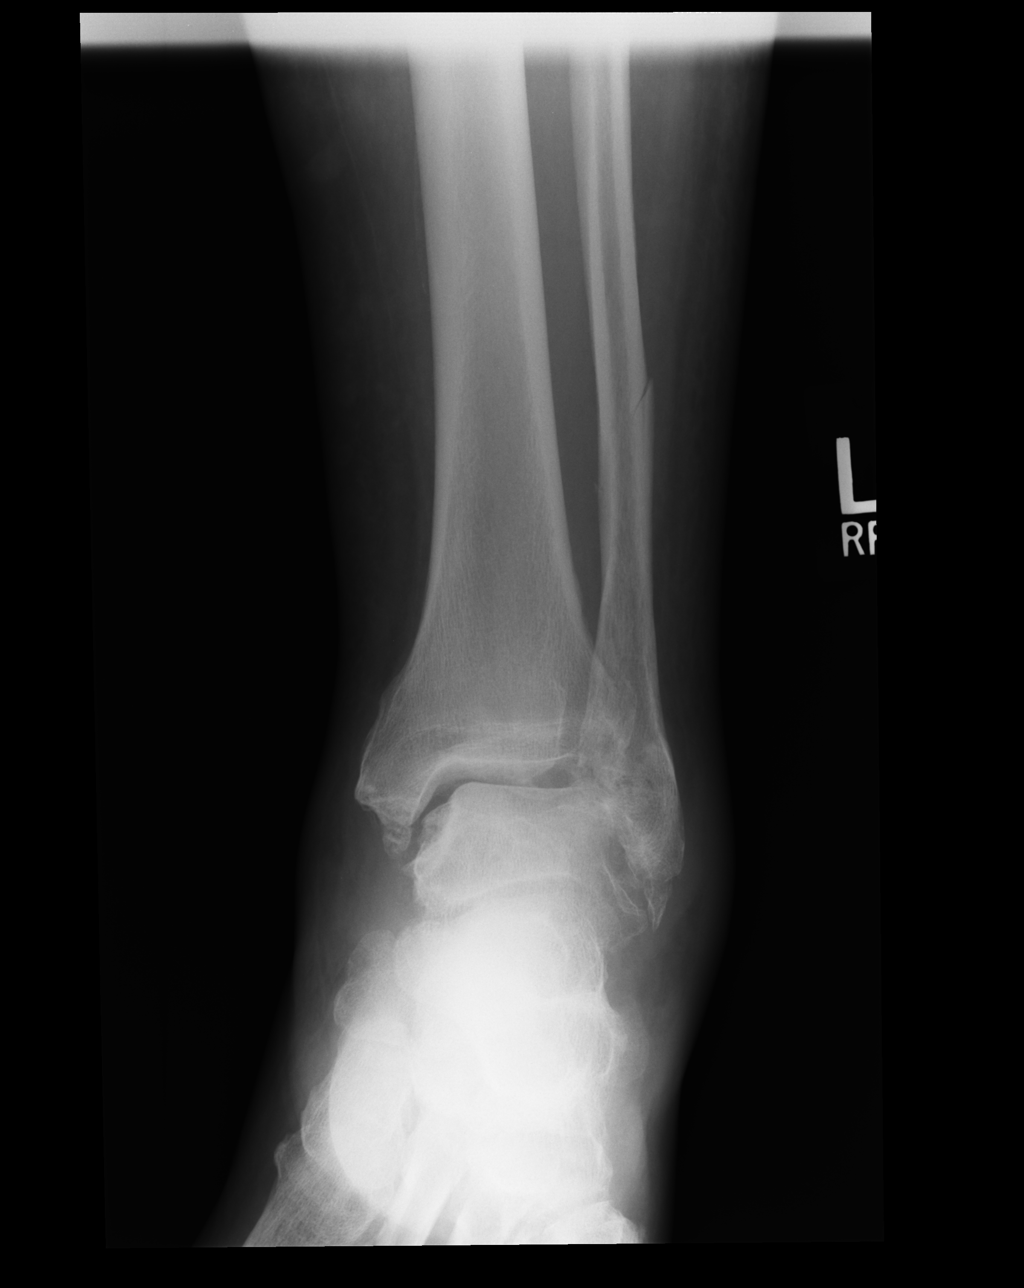

[ap obl int rot (1 of 2)]
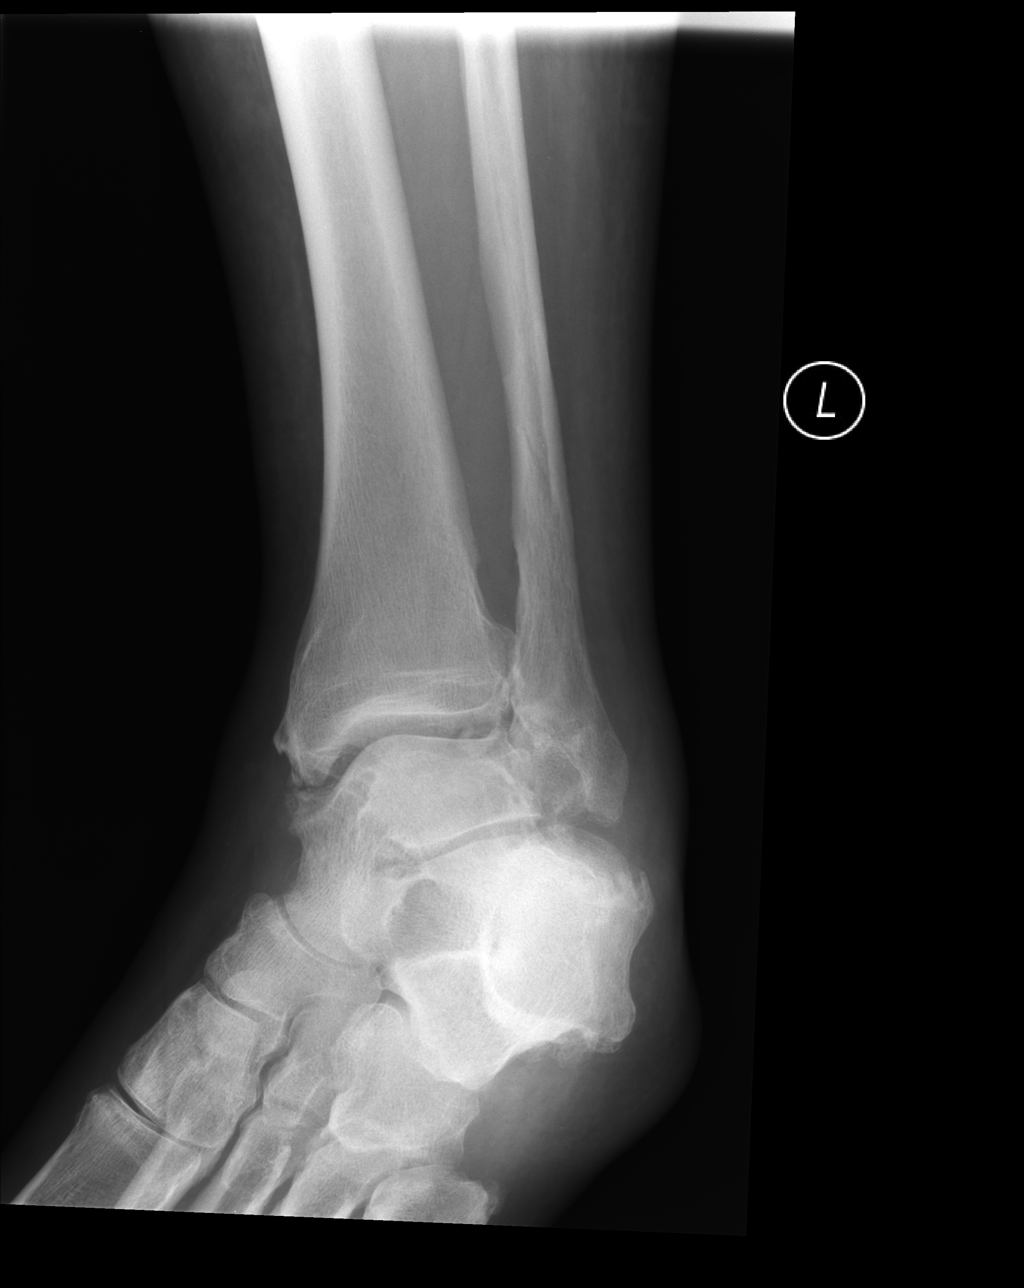

[ap obl int rot (2 of 2)]
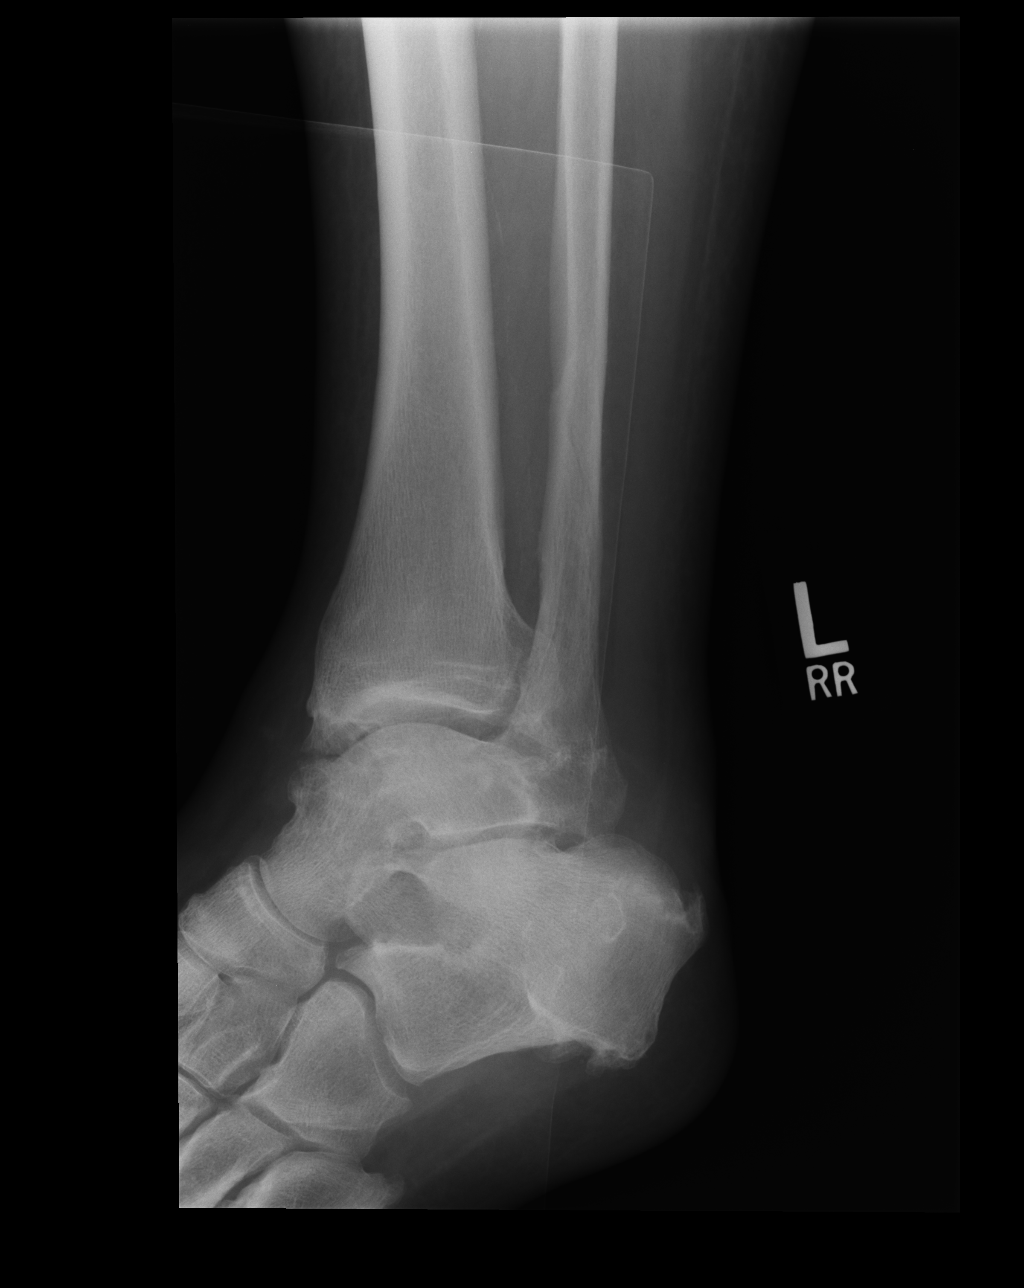

[lateral]
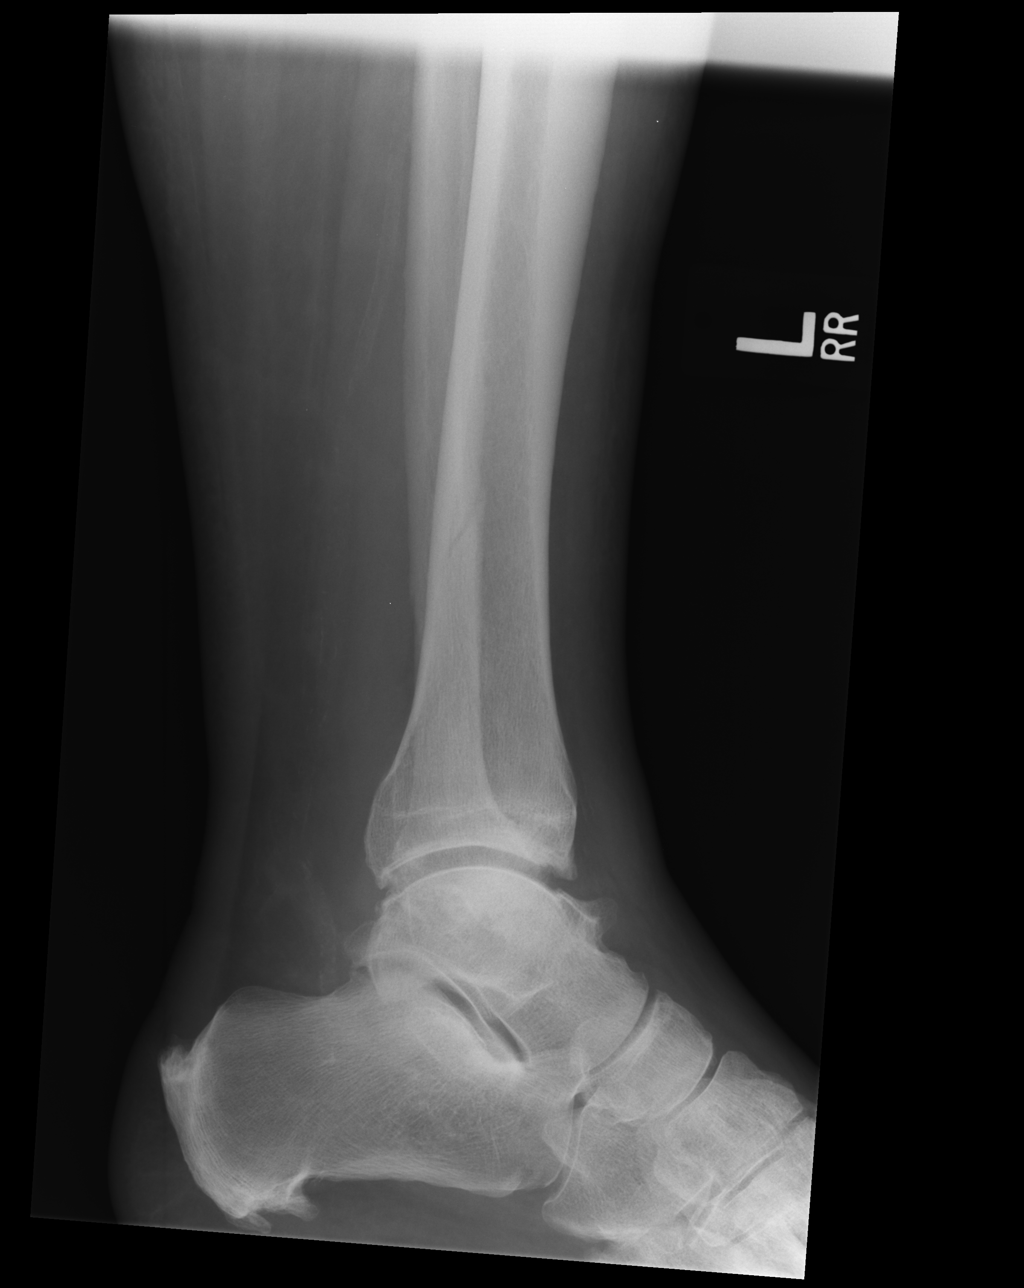

[4 of 4 positions shown; findings below may reference images not displayed]

FINDINGS: Degenerative changes within the left ankle with osteophyte
formation/spurring. No acute bony abnormality. Specifically, no
fracture, subluxation, or dislocation. Soft tissues are intact.
IMPRESSION: Degenerative changes.  No acute bony abnormality.

## 2016-06-30 IMAGING — CR DG TIBIA/FIBULA 2V*L*
2 series · 2 of 2 positions shown · non-contrast
Comparison: Left ankle series of today's date and June 24, 2015

CLINICAL DATA: Follow-up lower fibular fracture.

EXAM:
LEFT TIBIA AND FIBULA - 2 VIEW

[AP]
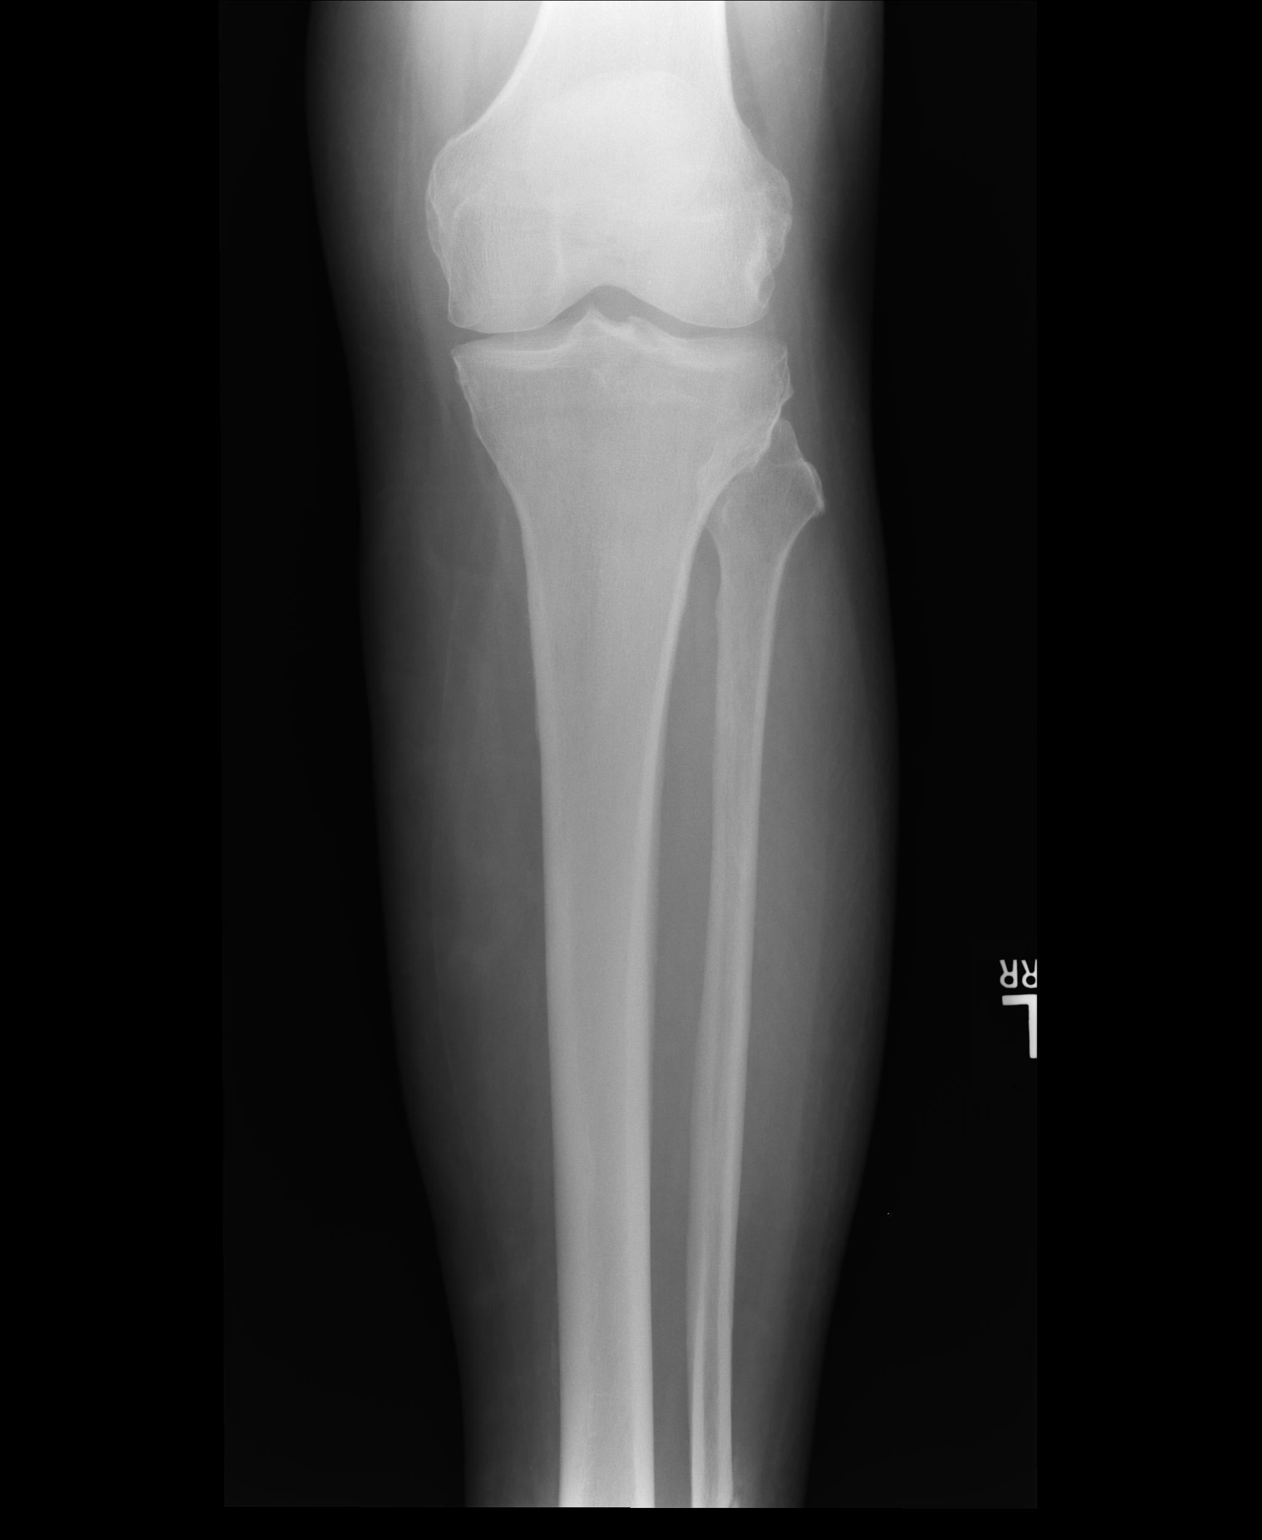

[lateral]
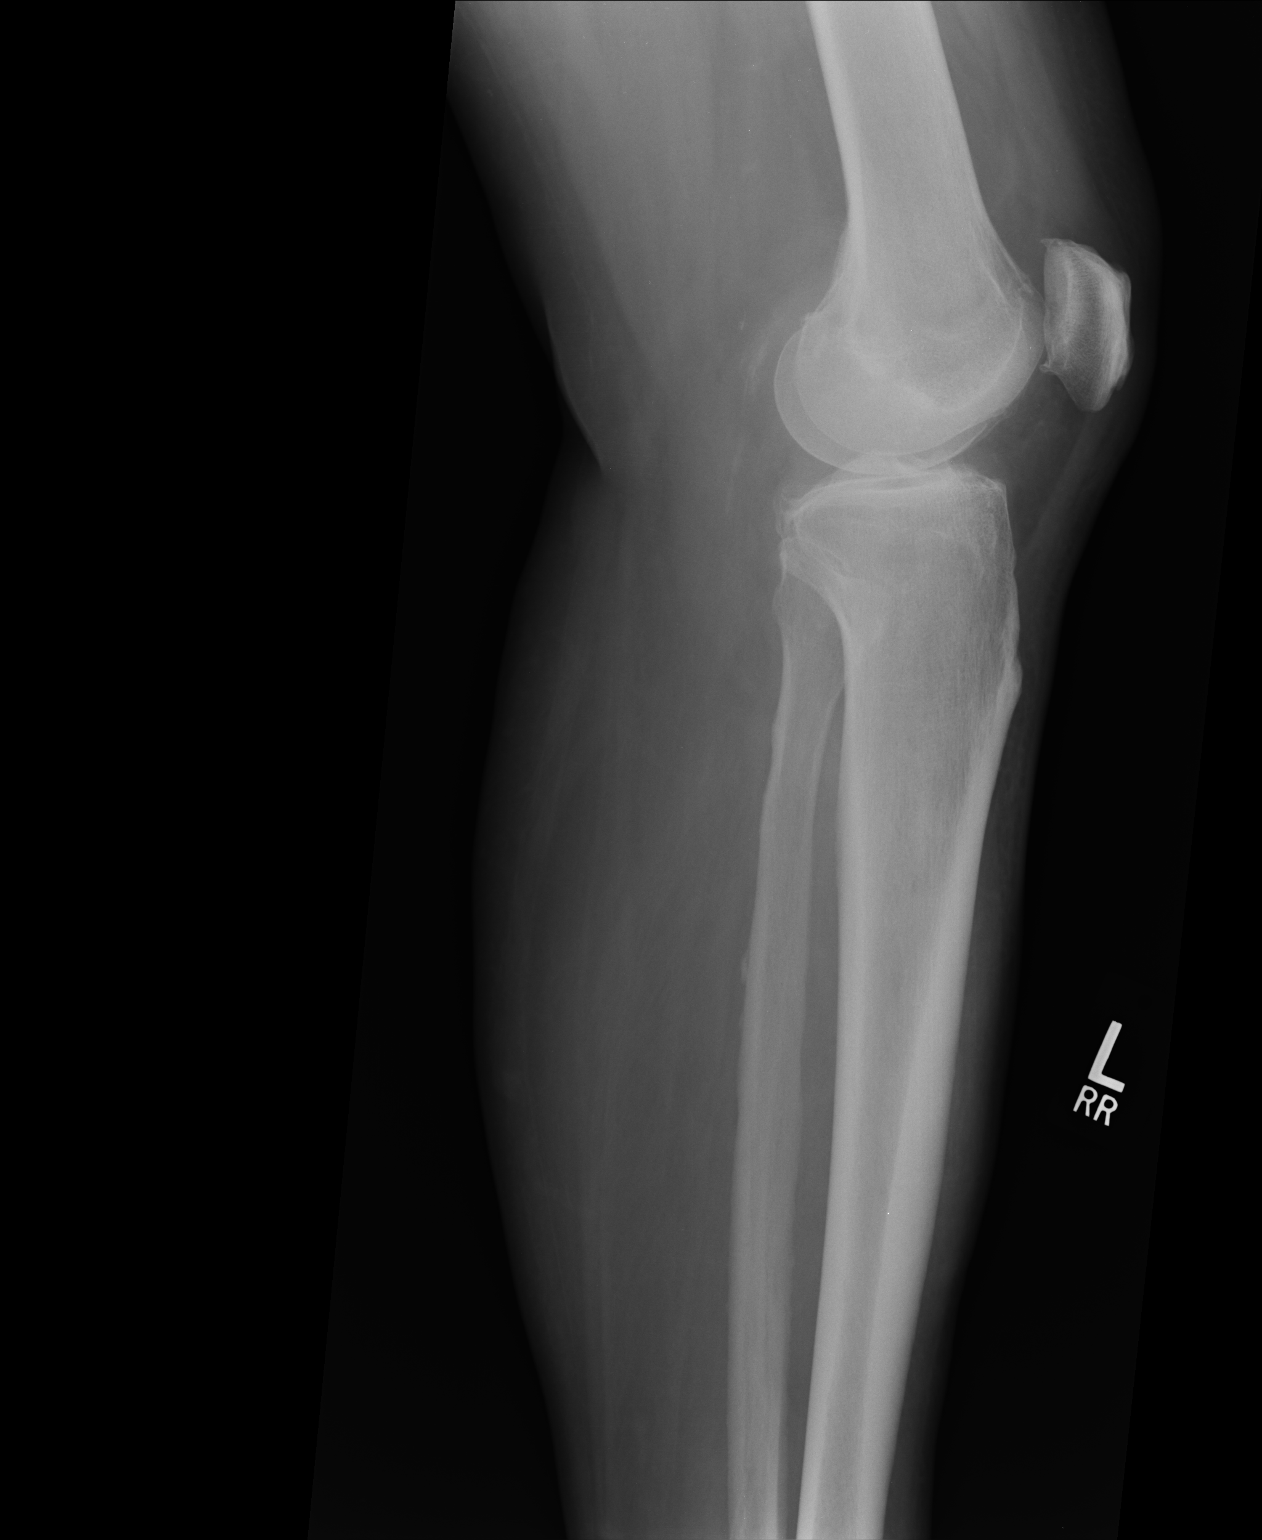

[2 of 2 positions shown; findings below may reference images not displayed]

FINDINGS: The fracture involving the distal third of the shaft of the left
fibula is not fully included in the field of view. There are mild
degenerative changes of the knee with beaking of the tibial spines
and spurring of the articular margins of the patella. No proximal or
midshaft tibial or fibular fractures are observed.
IMPRESSION: Patient has a known healing spiral fracture of the distal third of
the shaft of the left fibula. The proximal [DATE] of the tibia and
fibula appear intact.

## 2018-12-14 ENCOUNTER — Telehealth: Payer: Self-pay | Admitting: Emergency Medicine

## 2018-12-14 NOTE — Telephone Encounter (Signed)
Chart opened in error

## 2020-06-24 DEATH — deceased
